# Patient Record
Sex: Male | Born: 1980 | Race: Black or African American | Hispanic: No | Marital: Single | Smoking: Never smoker
Health system: Southern US, Community
[De-identification: ages and names within clinical notes are randomized; demographics above are authoritative.]

---

## 2000-02-12 ENCOUNTER — Encounter: Payer: Self-pay | Admitting: Emergency Medicine

## 2000-02-12 ENCOUNTER — Emergency Department (HOSPITAL_COMMUNITY): Admission: EM | Admit: 2000-02-12 | Discharge: 2000-02-12 | Payer: Self-pay | Admitting: Interventional Radiology

## 2018-08-18 ENCOUNTER — Emergency Department (HOSPITAL_COMMUNITY): Payer: No Typology Code available for payment source

## 2018-08-18 ENCOUNTER — Emergency Department (HOSPITAL_COMMUNITY)
Admission: EM | Admit: 2018-08-18 | Discharge: 2018-08-18 | Disposition: A | Discharge status: Home / Self Care | Payer: No Typology Code available for payment source | Attending: Emergency Medicine | Admitting: Emergency Medicine

## 2018-08-18 ENCOUNTER — Encounter (HOSPITAL_COMMUNITY): Payer: Self-pay | Admitting: Emergency Medicine

## 2018-08-18 DIAGNOSIS — S299XXA Unspecified injury of thorax, initial encounter: Secondary | ICD-10-CM | POA: Diagnosis not present

## 2018-08-18 DIAGNOSIS — Y9355 Activity, bike riding: Secondary | ICD-10-CM | POA: Diagnosis not present

## 2018-08-18 DIAGNOSIS — Y929 Unspecified place or not applicable: Secondary | ICD-10-CM | POA: Diagnosis not present

## 2018-08-18 DIAGNOSIS — S025XXA Fracture of tooth (traumatic), initial encounter for closed fracture: Secondary | ICD-10-CM | POA: Diagnosis not present

## 2018-08-18 DIAGNOSIS — T07XXXA Unspecified multiple injuries, initial encounter: Secondary | ICD-10-CM | POA: Diagnosis not present

## 2018-08-18 DIAGNOSIS — S8001XA Contusion of right knee, initial encounter: Secondary | ICD-10-CM | POA: Insufficient documentation

## 2018-08-18 DIAGNOSIS — Y999 Unspecified external cause status: Secondary | ICD-10-CM | POA: Insufficient documentation

## 2018-08-18 DIAGNOSIS — S0181XA Laceration without foreign body of other part of head, initial encounter: Secondary | ICD-10-CM | POA: Diagnosis not present

## 2018-08-18 DIAGNOSIS — Z23 Encounter for immunization: Secondary | ICD-10-CM | POA: Insufficient documentation

## 2018-08-18 DIAGNOSIS — S0990XA Unspecified injury of head, initial encounter: Secondary | ICD-10-CM | POA: Diagnosis present

## 2018-08-18 DIAGNOSIS — T1490XA Injury, unspecified, initial encounter: Secondary | ICD-10-CM

## 2018-08-18 DIAGNOSIS — S3991XA Unspecified injury of abdomen, initial encounter: Secondary | ICD-10-CM | POA: Diagnosis not present

## 2018-08-18 LAB — PROTIME-INR
INR: 0.97
PROTHROMBIN TIME: 12.8 s (ref 11.4–15.2)

## 2018-08-18 LAB — COMPREHENSIVE METABOLIC PANEL
ALT: 31 U/L (ref 0–44)
ANION GAP: 13 (ref 5–15)
AST: 55 U/L — ABNORMAL HIGH (ref 15–41)
Albumin: 3.9 g/dL (ref 3.5–5.0)
Alkaline Phosphatase: 55 U/L (ref 38–126)
BUN: 14 mg/dL (ref 6–20)
CHLORIDE: 103 mmol/L (ref 98–111)
CO2: 21 mmol/L — AB (ref 22–32)
Calcium: 9.3 mg/dL (ref 8.9–10.3)
Creatinine, Ser: 0.94 mg/dL (ref 0.61–1.24)
GFR calc non Af Amer: >60 mL/min (ref >60)
Glucose, Bld: 123 mg/dL — ABNORMAL HIGH (ref 70–99)
Potassium: 3.9 mmol/L (ref 3.5–5.1)
SODIUM: 137 mmol/L (ref 135–145)
Total Bilirubin: 0.7 mg/dL (ref 0.3–1.2)
Total Protein: 6.4 g/dL — ABNORMAL LOW (ref 6.5–8.1)

## 2018-08-18 LAB — I-STAT CHEM 8, ED
BUN: 24 mg/dL — ABNORMAL HIGH (ref 6–20)
CALCIUM ION: 1.09 mmol/L — AB (ref 1.15–1.40)
Chloride: 104 mmol/L (ref 98–111)
Creatinine, Ser: 0.9 mg/dL (ref 0.61–1.24)
GLUCOSE: 115 mg/dL — AB (ref 70–99)
HCT: 39 % (ref 39.0–52.0)
Hemoglobin: 13.3 g/dL (ref 13.0–17.0)
POTASSIUM: 3.6 mmol/L (ref 3.5–5.1)
Sodium: 139 mmol/L (ref 135–145)
TCO2: 23 mmol/L (ref 22–32)

## 2018-08-18 LAB — CDS SEROLOGY

## 2018-08-18 LAB — I-STAT CG4 LACTIC ACID, ED: LACTIC ACID, VENOUS: 1.58 mmol/L (ref 0.5–1.9)

## 2018-08-18 LAB — SAMPLE TO BLOOD BANK

## 2018-08-18 LAB — CBC
HCT: 37.2 % — ABNORMAL LOW (ref 39.0–52.0)
HEMOGLOBIN: 12.4 g/dL — AB (ref 13.0–17.0)
MCH: 30.8 pg (ref 26.0–34.0)
MCHC: 33.3 g/dL (ref 30.0–36.0)
MCV: 92.3 fL (ref 78.0–100.0)
Platelets: 173 10*3/uL (ref 150–400)
RBC: 4.03 MIL/uL — ABNORMAL LOW (ref 4.22–5.81)
RDW: 13.2 % (ref 11.5–15.5)
WBC: 10.8 10*3/uL — ABNORMAL HIGH (ref 4.0–10.5)

## 2018-08-18 LAB — ETHANOL: Alcohol, Ethyl (B): <10 mg/dL (ref <10)

## 2018-08-18 MED ORDER — ACETAMINOPHEN 500 MG PO TABS: 1000.0000 mg | ORAL_TABLET | Freq: Three times a day (TID) | ORAL | 0 refills | Status: AC

## 2018-08-18 MED ORDER — HYDROMORPHONE HCL 1 MG/ML IJ SOLN
1.0000 mg | Freq: Once | INTRAMUSCULAR | Status: AC
  Administered 2018-08-18: 1 mg via INTRAVENOUS
  Filled 2018-08-18: qty 1

## 2018-08-18 MED ORDER — SODIUM CHLORIDE 0.9 % IV SOLN: INTRAVENOUS | Status: DC

## 2018-08-18 MED ORDER — IOHEXOL 300 MG/ML  SOLN
100.0000 mL | Freq: Once | INTRAMUSCULAR | Status: AC | PRN
  Administered 2018-08-18: 100 mL via INTRAVENOUS

## 2018-08-18 MED ORDER — LIDOCAINE HCL (PF) 1 % IJ SOLN
INTRAMUSCULAR | Status: AC
  Filled 2018-08-18: qty 5

## 2018-08-18 MED ORDER — TETANUS-DIPHTH-ACELL PERTUSSIS 5-2.5-18.5 LF-MCG/0.5 IM SUSP
INTRAMUSCULAR | Status: AC
  Administered 2018-08-18: 0.5 mL via INTRAMUSCULAR
  Filled 2018-08-18: qty 0.5

## 2018-08-18 MED ORDER — LIDOCAINE-EPINEPHRINE 2 %-1:100000 IJ SOLN
10.0000 mL | Freq: Once | INTRAMUSCULAR | Status: DC
  Filled 2018-08-18: qty 10

## 2018-08-18 MED ORDER — IBUPROFEN 600 MG PO TABS: 600.0000 mg | ORAL_TABLET | Freq: Four times a day (QID) | ORAL | 0 refills | Status: AC | PRN

## 2018-08-18 MED ORDER — SODIUM CHLORIDE 0.9 % IV BOLUS
1000.0000 mL | Freq: Once | INTRAVENOUS | Status: AC
  Administered 2018-08-18: 1000 mL via INTRAVENOUS

## 2018-08-18 MED ORDER — IBUPROFEN 600 MG PO TABS: 600.0000 mg | ORAL_TABLET | Freq: Four times a day (QID) | ORAL | 0 refills | Status: DC | PRN
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
## 2018-08-18 NOTE — ED Notes (Signed)
Per patient's request-Patient's step father contacted to make him aware patient is here in the ED

## 2018-08-18 NOTE — ED Notes (Signed)
Family at bedside. 

## 2018-08-18 NOTE — ED Notes (Signed)
Patient and visitor verbalized understanding of all discharge and follow up instructions. VSS, resp e/u, nad. Patient taken out of ED via wheelchair with Mother at bedside
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
## 2018-08-24 ENCOUNTER — Encounter (HOSPITAL_COMMUNITY): Payer: Self-pay | Admitting: Neurology

## 2018-08-24 ENCOUNTER — Emergency Department (HOSPITAL_COMMUNITY)
Admission: EM | Admit: 2018-08-24 | Discharge: 2018-08-24 | Disposition: A | Discharge status: Home / Self Care | Payer: Self-pay | Attending: Emergency Medicine | Admitting: Emergency Medicine

## 2018-08-24 DIAGNOSIS — Z5189 Encounter for other specified aftercare: Secondary | ICD-10-CM

## 2018-08-24 DIAGNOSIS — Z4800 Encounter for change or removal of nonsurgical wound dressing: Secondary | ICD-10-CM | POA: Insufficient documentation

## 2018-08-24 DIAGNOSIS — Z79899 Other long term (current) drug therapy: Secondary | ICD-10-CM | POA: Insufficient documentation

## 2018-08-24 MED ORDER — IBUPROFEN 800 MG PO TABS: 800.0000 mg | ORAL_TABLET | Freq: Three times a day (TID) | ORAL | 0 refills | Status: AC

## 2018-08-24 NOTE — Discharge Instructions (Addendum)
I  have prescribed pain medication, please take as needed.Make sure to elevate left ankle and apply ice to the area. Please schedule an appointment with any of the dental referrals provided to you today. Please follow up with PCP as needed.
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
## 2018-08-24 NOTE — Progress Notes (Signed)
CSW spoke with pt at bedside regarding dental resources. CSW provided pt with information for the [facility] [employer] as well as other local dental resources. Pt thanked CSW and presented no further needs at this time.   CSW will sign off as no further CSW interventions are needed.    Claude Manges Iness Pangilinan, MSW, LCSW-A Emergency Department Clinical Social Worker [phone]
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
